# Patient Record
Sex: Female | Born: 1965 | Race: White | Hispanic: No | Marital: Single | Smoking: Former smoker
Health system: Southern US, Community
[De-identification: ages and names within clinical notes are randomized; demographics above are authoritative.]

## PROBLEM LIST (undated history)

## (undated) DIAGNOSIS — U099 Post covid-19 condition, unspecified: Secondary | ICD-10-CM

## (undated) DIAGNOSIS — E079 Disorder of thyroid, unspecified: Secondary | ICD-10-CM

## (undated) DIAGNOSIS — B1920 Unspecified viral hepatitis C without hepatic coma: Secondary | ICD-10-CM

## (undated) DIAGNOSIS — I10 Essential (primary) hypertension: Secondary | ICD-10-CM

## (undated) DIAGNOSIS — J449 Chronic obstructive pulmonary disease, unspecified: Secondary | ICD-10-CM

## (undated) HISTORY — PX: ABDOMINAL HYSTERECTOMY: SHX81

## (undated) HISTORY — PX: COLONOSCOPY: SHX174

---

## 2014-09-23 ENCOUNTER — Emergency Department (HOSPITAL_BASED_OUTPATIENT_CLINIC_OR_DEPARTMENT_OTHER): Payer: Self-pay

## 2014-09-23 ENCOUNTER — Encounter (HOSPITAL_BASED_OUTPATIENT_CLINIC_OR_DEPARTMENT_OTHER): Payer: Self-pay | Admitting: *Deleted

## 2014-09-23 ENCOUNTER — Emergency Department (HOSPITAL_BASED_OUTPATIENT_CLINIC_OR_DEPARTMENT_OTHER)
Admission: EM | Admit: 2014-09-23 | Discharge: 2014-09-23 | Disposition: A | Discharge status: Home / Self Care | Payer: Self-pay | Attending: Emergency Medicine | Admitting: Emergency Medicine

## 2014-09-23 DIAGNOSIS — R059 Cough, unspecified: Secondary | ICD-10-CM

## 2014-09-23 DIAGNOSIS — I10 Essential (primary) hypertension: Secondary | ICD-10-CM | POA: Insufficient documentation

## 2014-09-23 DIAGNOSIS — Z8619 Personal history of other infectious and parasitic diseases: Secondary | ICD-10-CM | POA: Insufficient documentation

## 2014-09-23 DIAGNOSIS — R05 Cough: Secondary | ICD-10-CM

## 2014-09-23 DIAGNOSIS — Z791 Long term (current) use of non-steroidal anti-inflammatories (NSAID): Secondary | ICD-10-CM | POA: Insufficient documentation

## 2014-09-23 DIAGNOSIS — Z79899 Other long term (current) drug therapy: Secondary | ICD-10-CM | POA: Insufficient documentation

## 2014-09-23 DIAGNOSIS — R079 Chest pain, unspecified: Secondary | ICD-10-CM | POA: Insufficient documentation

## 2014-09-23 DIAGNOSIS — E079 Disorder of thyroid, unspecified: Secondary | ICD-10-CM | POA: Insufficient documentation

## 2014-09-23 DIAGNOSIS — J441 Chronic obstructive pulmonary disease with (acute) exacerbation: Secondary | ICD-10-CM | POA: Insufficient documentation

## 2014-09-23 DIAGNOSIS — Z720 Tobacco use: Secondary | ICD-10-CM | POA: Insufficient documentation

## 2014-09-23 HISTORY — DX: Essential (primary) hypertension: I10

## 2014-09-23 HISTORY — DX: Chronic obstructive pulmonary disease, unspecified: J44.9

## 2014-09-23 HISTORY — DX: Unspecified viral hepatitis C without hepatic coma: B19.20

## 2014-09-23 HISTORY — DX: Disorder of thyroid, unspecified: E07.9

## 2014-09-23 MED ORDER — PROMETHAZINE-CODEINE 6.25-10 MG/5ML PO SYRP: 10.0000 mL | ORAL_SOLUTION | Freq: Four times a day (QID) | ORAL | Status: DC | PRN

## 2014-09-23 MED ORDER — TRAMADOL HCL 50 MG PO TABS
50.0000 mg | ORAL_TABLET | Freq: Four times a day (QID) | ORAL | Status: DC | PRN
Start: 2014-09-23 — End: 2019-06-13

## 2014-09-23 MED ORDER — NAPROXEN 500 MG PO TABS: 500.0000 mg | ORAL_TABLET | Freq: Two times a day (BID) | ORAL | Status: DC

## 2014-09-23 MED ORDER — ALBUTEROL SULFATE HFA 108 (90 BASE) MCG/ACT IN AERS: 1.0000 | INHALATION_SPRAY | Freq: Four times a day (QID) | RESPIRATORY_TRACT | Status: DC | PRN

## 2014-09-23 NOTE — ED Notes (Signed)
Pt c/o left sided chest and breast pain x 3 with SOB

## 2014-09-23 NOTE — ED Provider Notes (Signed)
CSN: [id number]     Arrival date & time 09/23/14  1339 History   First MD Initiated Contact with Patient 09/23/14 1401     Chief Complaint  Patient presents with  . Chest Pain     HPI  Disposition evaluation chest pain. Has had a cough for the last several days hurts her left lateral chest when she coughs dry nonproductive. Reports he continues to smoke. Out of her inhaler at home.  Past Medical History  Diagnosis Date  . Thyroid disease   . Hypertension   . COPD (chronic obstructive pulmonary disease)   . Hepatitis C    Past Surgical History  Procedure Laterality Date  . Abdominal hysterectomy     History reviewed. No pertinent family history. History  Substance Use Topics  . Smoking status: Current Every Day Smoker -- 1.00 packs/day    Types: Cigarettes  . Smokeless tobacco: Not on file  . Alcohol Use: No    Review of Systems  Constitutional: Negative for fever, chills, diaphoresis, appetite change and fatigue.  HENT: Negative for mouth sores, sore throat and trouble swallowing.   Eyes: Negative for visual disturbance.  Respiratory: Positive for shortness of breath. Negative for cough, chest tightness and wheezing.   Cardiovascular: Positive for chest pain.  Gastrointestinal: Negative for nausea, vomiting, abdominal pain, diarrhea and abdominal distention.  Endocrine: Negative for polydipsia, polyphagia and polyuria.  Genitourinary: Negative for dysuria, frequency and hematuria.  Musculoskeletal: Negative for gait problem.  Skin: Negative for color change, pallor and rash.  Neurological: Negative for dizziness, syncope, light-headedness and headaches.  Hematological: Does not bruise/bleed easily.  Psychiatric/Behavioral: Negative for behavioral problems and confusion.      Allergies  Review of patient's allergies indicates no known allergies.  Home Medications   Prior to Admission medications   Medication Sig Start Date End Date Taking? Authorizing Provider    albuterol (PROVENTIL HFA;VENTOLIN HFA) 108 (90 BASE) MCG/ACT inhaler Inhale 1-2 puffs into the lungs every 6 (six) hours as needed for wheezing. 09/23/14   Rolland PorterMark Lismary Kiehn, MD  levothyroxine (SYNTHROID, LEVOTHROID) 50 MCG tablet Take 25 mcg by mouth daily before breakfast.   Yes Historical Provider, MD  lisinopril (PRINIVIL,ZESTRIL) 10 MG tablet Take 10 mg by mouth daily.   Yes Historical Provider, MD  naproxen (NAPROSYN) 500 MG tablet Take 1 tablet (500 mg total) by mouth 2 (two) times daily. 09/23/14   Rolland PorterMark Ellorie Kindall, MD  traMADol (ULTRAM) 50 MG tablet Take 1 tablet (50 mg total) by mouth every 6 (six) hours as needed. 09/23/14   Rolland PorterMark Ewell Benassi, MD   BP 117/76 mmHg  Pulse 60  Temp(Src) 98.3 F (36.8 C) (Oral)  Resp 16  SpO2 100% Physical Exam  Constitutional: He is oriented to person, place, and time. He appears well-developed and well-nourished. No distress.  HENT:  Head: Normocephalic.  Eyes: Conjunctivae are normal. Pupils are equal, round, and reactive to light. No scleral icterus.  Neck: Normal range of motion. Neck supple. No thyromegaly present.  Cardiovascular: Normal rate and regular rhythm.  Exam reveals no gallop and no friction rub.   No murmur heard. Pulmonary/Chest: Effort normal and breath sounds normal. No respiratory distress. He has no wheezes. He has no rales.    Abdominal: Soft. Bowel sounds are normal. He exhibits no distension. There is no tenderness. There is no rebound.  Musculoskeletal: Normal range of motion.  Neurological: He is alert and oriented to person, place, and time.  Skin: Skin is warm and dry. No rash  noted.  Psychiatric: He has a normal mood and affect. His behavior is normal.    ED Course  Procedures (including critical care time) Labs Review Labs Reviewed - No data to display  Imaging Review Dg Chest 2 View  09/23/2014   CLINICAL DATA:  Productive cough  EXAM: CHEST  2 VIEW  COMPARISON:  None.  FINDINGS: Lungs are clear.  No pleural effusion or  pneumothorax.  The heart is normal in size.  Visualized osseous structures are within normal limits.  IMPRESSION: Normal chest radiographs.   Electronically Signed   By: Charline Bills M.D.   On: 09/23/2014 14:42     EKG Interpretation None      MDM   Final diagnoses:  Chest pain  Cough   Is not concerning for cardiac etiology. Clinically with clear lungs well oxygenated now wheezing. No graphic pneumonia. Plan is home. Strongly encouraged to stop smoking. Pain medicine and anti-inflammatories cause present.    Rolland Porter, MD 09/23/14 [phone]

## 2014-09-23 NOTE — Discharge Instructions (Signed)
Chest Pain (Nonspecific) °It is often hard to give a diagnosis for the cause of chest pain. There is always a chance that your pain could be related to something serious, such as a heart attack or a blood clot in the lungs. You need to follow up with your doctor. °HOME CARE °· If antibiotic medicine was given, take it as directed by your doctor. Finish the medicine even if you start to feel better. °· For the next few days, avoid activities that bring on chest pain. Continue physical activities as told by your doctor. °· Do not use any tobacco products. This includes cigarettes, chewing tobacco, and e-cigarettes. °· Avoid drinking alcohol. °· Only take medicine as told by your doctor. °· Follow your doctor's suggestions for more testing if your chest pain does not go away. °· Keep all doctor visits you made. °GET HELP IF: °· Your chest pain does not go away, even after treatment. °· You have a rash with blisters on your chest. °· You have a fever. °GET HELP RIGHT AWAY IF:  °· You have more pain or pain that spreads to your arm, neck, jaw, back, or belly (abdomen). °· You have shortness of breath. °· You cough more than usual or cough up blood. °· You have very bad back or belly pain. °· You feel sick to your stomach (nauseous) or throw up (vomit). °· You have very bad weakness. °· You pass out (faint). °· You have chills. °This is an emergency. Do not wait to see if the problems will go away. Call your local emergency services (911 in [place] Do not drive yourself to the hospital. °MAKE SURE YOU:  °· Understand these instructions. °· Will watch your condition. °· Will get help right away if you are not doing well or get worse. °Document Released: 11/13/2007 Document Revised: 06/01/2013 Document Reviewed: 11/13/2007 °ExitCare® Patient Information ©2015 [employer]. This information is not intended to replace advice given to you by your health care provider. Make sure you discuss any questions you have with your  health care provider. ° °Cough, Adult ° A cough is a reflex that helps clear your throat and airways. It can help heal the body or may be a reaction to an irritated airway. A cough may only last 2 or 3 weeks (acute) or may last more than 8 weeks (chronic).  °CAUSES °Acute cough: °· Viral or bacterial infections. °Chronic cough: °· Infections. °· Allergies. °· Asthma. °· Post-nasal drip. °· Smoking. °· Heartburn or acid reflux. °· Some medicines. °· Chronic lung problems (COPD). °· Cancer. °SYMPTOMS  °· Cough. °· Fever. °· Chest pain. °· Increased breathing rate. °· High-pitched whistling sound when breathing (wheezing). °· Colored mucus that you cough up (sputum). °TREATMENT  °· A bacterial cough may be treated with antibiotic medicine. °· A viral cough must run its course and will not respond to antibiotics. °· Your caregiver may recommend other treatments if you have a chronic cough. °HOME CARE INSTRUCTIONS  °· Only take over-the-counter or prescription medicines for pain, discomfort, or fever as directed by your caregiver. Use cough suppressants only as directed by your caregiver. °· Use a cold steam vaporizer or humidifier in your bedroom or home to help loosen secretions. °· Sleep in a semi-upright position if your cough is worse at night. °· Rest as needed. °· Stop smoking if you smoke. °SEEK IMMEDIATE MEDICAL CARE IF:  °· You have pus in your sputum. °· Your cough starts to worsen. °· You cannot   control your cough with suppressants and are losing sleep. °· You begin coughing up blood. °· You have difficulty breathing. °· You develop pain which is getting worse or is uncontrolled with medicine. °· You have a fever. °MAKE SURE YOU:  °· Understand these instructions. °· Will watch your condition. °· Will get help right away if you are not doing well or get worse. °Document Released: 11/23/2010 Document Revised: 08/19/2011 Document Reviewed: 11/23/2010 °ExitCare® Patient Information ©2015 [employer]. This  information is not intended to replace advice given to you by your health care provider. Make sure you discuss any questions you have with your health care provider. ° °

## 2016-05-27 IMAGING — DX DG CHEST 2V
2 series · 2 of 2 positions shown · non-contrast
Comparison: None.

CLINICAL DATA: Productive cough

EXAM:
CHEST  2 VIEW

[chest pa]
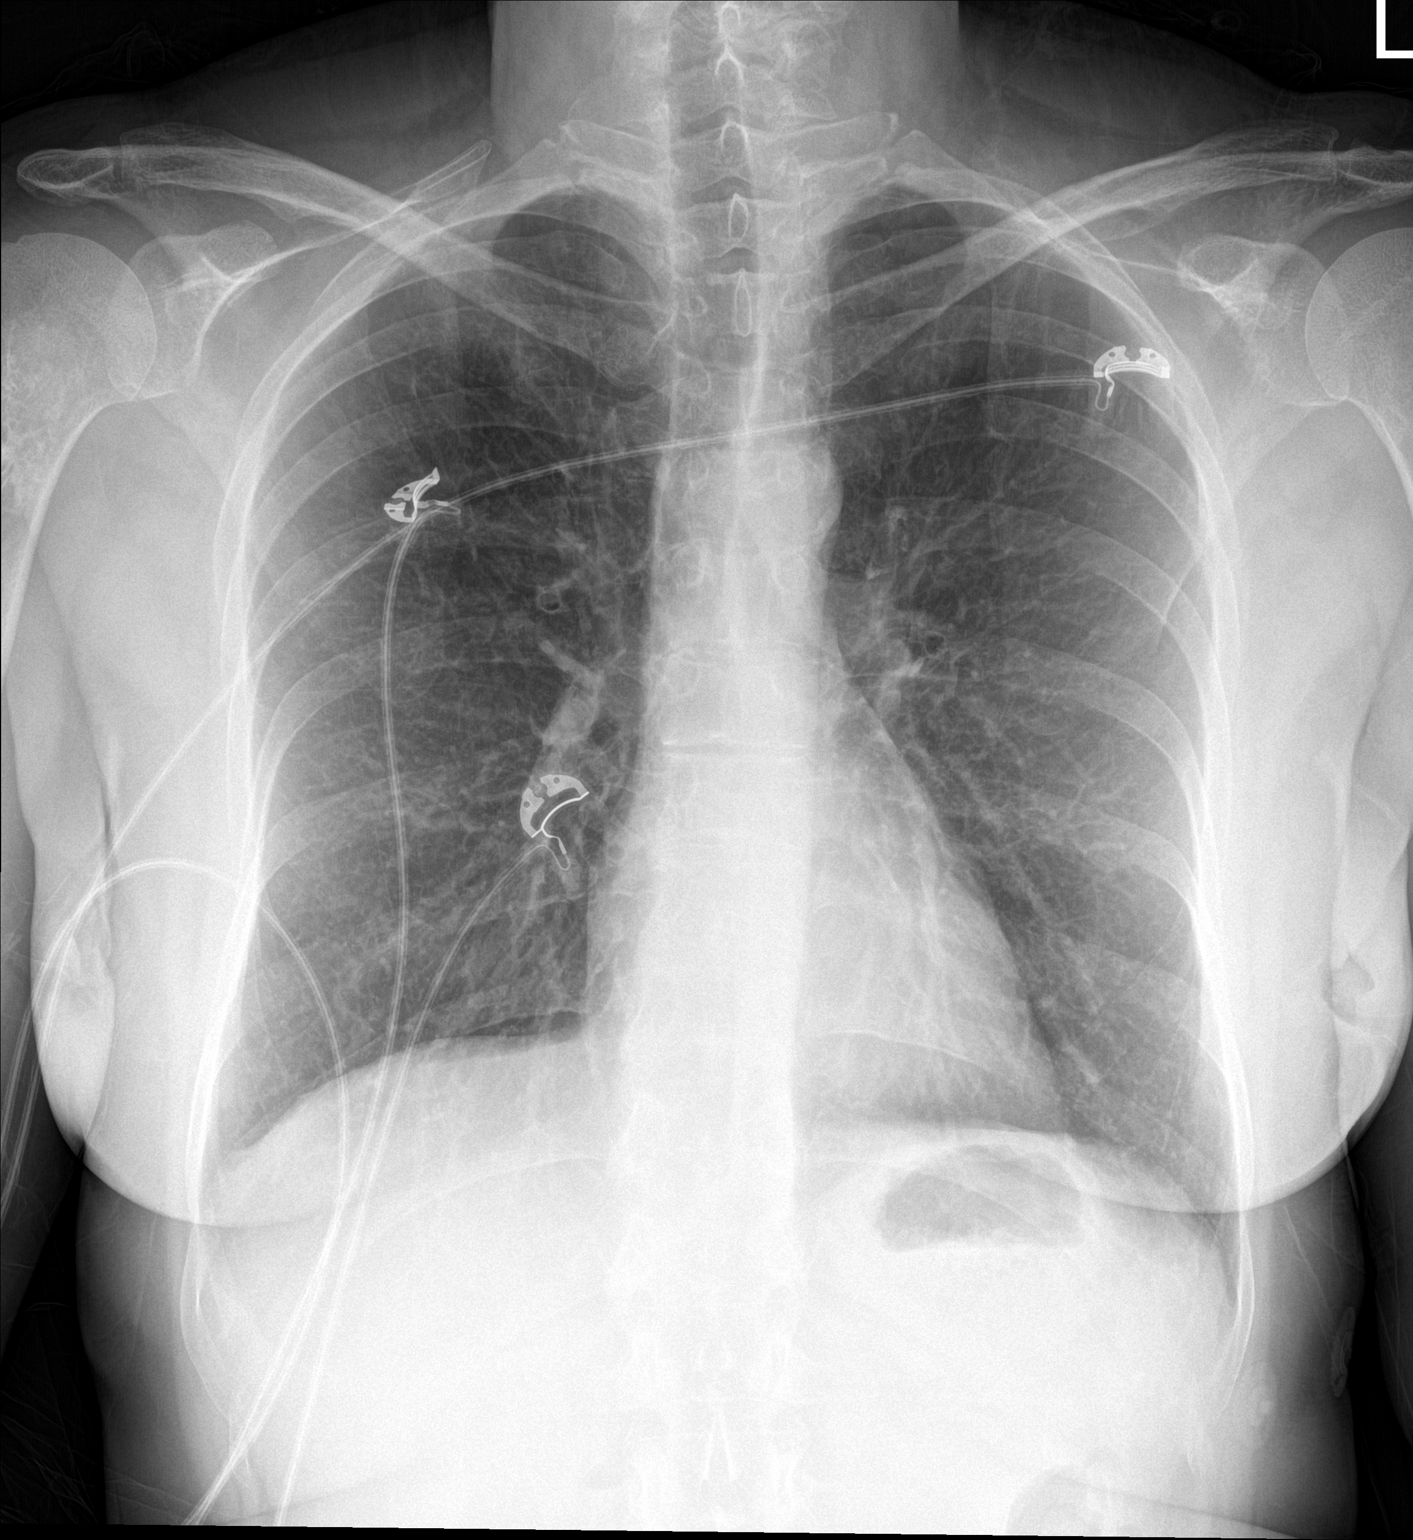

[chest lat]
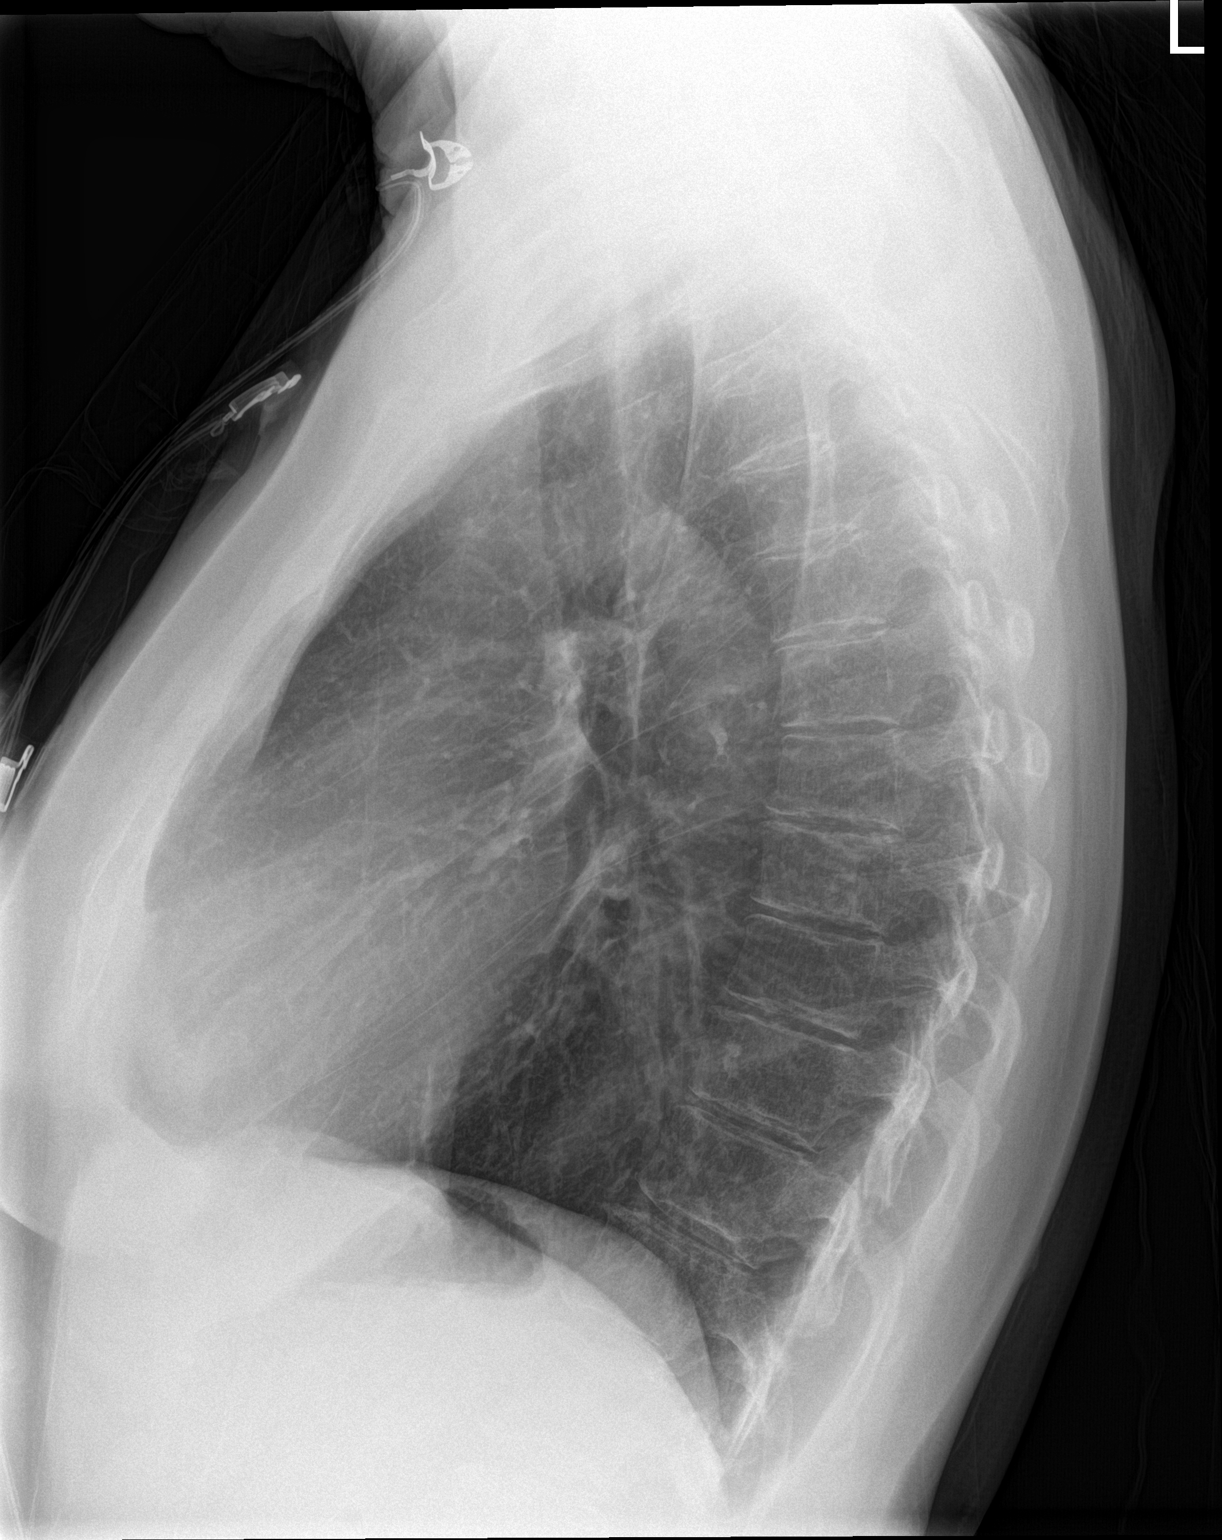

[2 of 2 positions shown; findings below may reference images not displayed]

FINDINGS: Lungs are clear.  No pleural effusion or pneumothorax.

The heart is normal in size.

Visualized osseous structures are within normal limits.
IMPRESSION: Normal chest radiographs.

## 2019-06-12 ENCOUNTER — Encounter (HOSPITAL_BASED_OUTPATIENT_CLINIC_OR_DEPARTMENT_OTHER): Payer: Self-pay | Admitting: Emergency Medicine

## 2019-06-12 ENCOUNTER — Other Ambulatory Visit: Payer: Self-pay

## 2019-06-12 DIAGNOSIS — J441 Chronic obstructive pulmonary disease with (acute) exacerbation: Secondary | ICD-10-CM | POA: Insufficient documentation

## 2019-06-12 DIAGNOSIS — Z79899 Other long term (current) drug therapy: Secondary | ICD-10-CM | POA: Insufficient documentation

## 2019-06-12 DIAGNOSIS — I10 Essential (primary) hypertension: Secondary | ICD-10-CM | POA: Insufficient documentation

## 2019-06-12 DIAGNOSIS — F17210 Nicotine dependence, cigarettes, uncomplicated: Secondary | ICD-10-CM | POA: Insufficient documentation

## 2019-06-12 DIAGNOSIS — Z20822 Contact with and (suspected) exposure to covid-19: Secondary | ICD-10-CM | POA: Insufficient documentation

## 2019-06-12 MED ORDER — ALBUTEROL SULFATE HFA 108 (90 BASE) MCG/ACT IN AERS
INHALATION_SPRAY | RESPIRATORY_TRACT | Status: AC
  Administered 2019-06-13: 2 via RESPIRATORY_TRACT
  Filled 2019-06-12: qty 6.7

## 2019-06-12 NOTE — ED Triage Notes (Signed)
Pt reports cough, shortness of breath and chest tightness since yesterday. Hx COPD. Reports she needs a med refill - has been without her respiratory meds for three days.

## 2019-06-13 ENCOUNTER — Emergency Department (HOSPITAL_BASED_OUTPATIENT_CLINIC_OR_DEPARTMENT_OTHER)
Admission: EM | Admit: 2019-06-13 | Discharge: 2019-06-13 | Disposition: A | Discharge status: Home / Self Care | Payer: Self-pay | Attending: Emergency Medicine | Admitting: Emergency Medicine

## 2019-06-13 DIAGNOSIS — J441 Chronic obstructive pulmonary disease with (acute) exacerbation: Secondary | ICD-10-CM

## 2019-06-13 LAB — SARS CORONAVIRUS 2 (TAT 6-24 HRS): SARS Coronavirus 2: NEGATIVE

## 2019-06-13 MED ORDER — PREDNISONE 50 MG PO TABS: 50.0000 mg | ORAL_TABLET | Freq: Every day | ORAL | 0 refills | Status: DC

## 2019-06-13 MED ORDER — ALBUTEROL SULFATE HFA 108 (90 BASE) MCG/ACT IN AERS: 2.0000 | INHALATION_SPRAY | RESPIRATORY_TRACT | Status: DC | PRN

## 2019-06-13 MED ORDER — LEVOFLOXACIN 500 MG PO TABS: 500.0000 mg | ORAL_TABLET | Freq: Every day | ORAL | 0 refills | Status: AC

## 2019-06-13 NOTE — ED Provider Notes (Signed)
[facility] Provider Note: Lowella Dell, MD, FACEP  CSN: [id number] MRN: [id number] ARRIVAL: 06/12/19 at 1948 ROOM: [facility]06/[facility]   CHIEF COMPLAINT  Cough   HISTORY OF PRESENT ILLNESS  06/13/19 1:18 AM Mallory Santiago is a 54 y.o. female with a history of COPD.  She has had a cough and shortness of breath along with chest tightness for the past 3 days.  Symptoms are made worse when breathing cold air.  Symptoms have been moderate.  She was given a new albuterol inhaler in triage with improvement in her shortness of breath.  She is requesting a course of steroids and Levaquin which is what she is usually given for acute exacerbations of her COPD.  She is visiting from out of town and does not have access to her primary care physician.  She denies fever.   Past Medical History:  Diagnosis Date  . COPD (chronic obstructive pulmonary disease) (HCC)   . Hepatitis C   . Hypertension   . Thyroid disease     Past Surgical History:  Procedure Laterality Date  . ABDOMINAL HYSTERECTOMY      No family history on file.  Social History   Tobacco Use  . Smoking status: Current Every Day Smoker    Packs/day: 1.00    Types: Cigarettes  . Smokeless tobacco: Never Used  Substance Use Topics  . Alcohol use: No  . Drug use: Yes    Prior to Admission medications   Medication Sig Start Date End Date Taking? Authorizing Provider  levofloxacin (LEVAQUIN) 500 MG tablet Take 1 tablet (500 mg total) by mouth daily. 06/13/19   Timarion Agcaoili, MD  levothyroxine (SYNTHROID, LEVOTHROID) 50 MCG tablet Take 25 mcg by mouth daily before breakfast.    [provider]  lisinopril (PRINIVIL,ZESTRIL) 10 MG tablet Take 10 mg by mouth daily.    [provider]  predniSONE (DELTASONE) 50 MG tablet Take 1 tablet (50 mg total) by mouth daily. 06/13/19   Autumne Kallio, MD  albuterol (PROVENTIL HFA;VENTOLIN HFA) 108 (90 BASE) MCG/ACT inhaler Inhale 1-2 puffs into the lungs every 6 (six) hours as  needed for wheezing. 09/23/14 06/13/19  Rolland Porter, MD    Allergies Codeine   REVIEW OF SYSTEMS  Negative except as noted here or in the History of Present Illness.   PHYSICAL EXAMINATION  Initial Vital Signs Blood pressure (!) 149/101, pulse 83, temperature 98 F (36.7 C), resp. rate 20, weight 63.4 kg, SpO2 96 %.  Examination General: Well-developed, well-nourished female in no acute distress; appearance consistent with age of record HENT: normocephalic; atraumatic Eyes: pupils equal, round and reactive to light; extraocular muscles intact Neck: supple Heart: regular rate and rhythm Lungs: clear to auscultation bilaterally; frequent dry cough Abdomen: soft; nondistended; nontender; bowel sounds present Extremities: No deformity; full range of motion; pulses normal Neurologic: Awake, alert and oriented; motor function intact in all extremities and symmetric; no facial droop Skin: Warm and dry Psychiatric: Normal mood and affect   RESULTS  Summary of this visit's results, reviewed and interpreted by myself:   EKG Interpretation  Date/Time:  Saturday June 12 2019 20:02:03 EST Ventricular Rate:  82 PR Interval:  140 QRS Duration: 86 QT Interval:  370 QTC Calculation: 432 R Axis:   61 Text Interpretation: Normal sinus rhythm Right atrial enlargement Nonspecific ST abnormality Abnormal ECG No significant change was found Confirmed by Glynn Octave [phone]) on 06/12/2019 8:25:51 PM      Laboratory Studies: No results found for  this or any previous visit (from the past 24 hour(s)). Imaging Studies: No results found.  ED COURSE and MDM  Nursing notes, initial and subsequent vitals signs, including pulse oximetry, reviewed and interpreted by myself.  Vitals:   06/12/19 1959 06/12/19 2006 06/12/19 2300 06/13/19 0124  BP: (!) 149/98  (!) 149/101   Pulse: 83  83   Resp: 18  20   Temp: 98 F (36.7 C)     SpO2: 99%  96% 94%  Weight:  63.4 kg     Medications   albuterol (VENTOLIN HFA) 108 (90 Base) MCG/ACT inhaler 2 puff (2 puffs Inhalation Given 06/13/19 0124)   Will place patient on Levaquin and steroids which is her usual protocol.  Will also test for Covid as this could be responsible for her acute exacerbation despite lack of fever or body aches.   PROCEDURES  Procedures   ED DIAGNOSES     ICD-10-CM   1. COPD exacerbation ([place])  J44.1        Isaack Preble, MD 06/13/19 [phone]

## 2019-06-14 ENCOUNTER — Telehealth (HOSPITAL_COMMUNITY): Payer: Self-pay

## 2020-01-15 ENCOUNTER — Other Ambulatory Visit: Payer: Self-pay

## 2020-01-15 DIAGNOSIS — J441 Chronic obstructive pulmonary disease with (acute) exacerbation: Secondary | ICD-10-CM | POA: Insufficient documentation

## 2020-01-15 DIAGNOSIS — Z20822 Contact with and (suspected) exposure to covid-19: Secondary | ICD-10-CM | POA: Insufficient documentation

## 2020-01-15 DIAGNOSIS — I10 Essential (primary) hypertension: Secondary | ICD-10-CM | POA: Insufficient documentation

## 2020-01-15 DIAGNOSIS — F17210 Nicotine dependence, cigarettes, uncomplicated: Secondary | ICD-10-CM | POA: Insufficient documentation

## 2020-01-16 ENCOUNTER — Emergency Department (HOSPITAL_BASED_OUTPATIENT_CLINIC_OR_DEPARTMENT_OTHER)
Admission: EM | Admit: 2020-01-16 | Discharge: 2020-01-16 | Disposition: A | Discharge status: Home / Self Care | Payer: Self-pay | Attending: Emergency Medicine | Admitting: Emergency Medicine

## 2020-01-16 ENCOUNTER — Encounter (HOSPITAL_BASED_OUTPATIENT_CLINIC_OR_DEPARTMENT_OTHER): Payer: Self-pay | Admitting: *Deleted

## 2020-01-16 ENCOUNTER — Emergency Department (HOSPITAL_BASED_OUTPATIENT_CLINIC_OR_DEPARTMENT_OTHER): Payer: Self-pay

## 2020-01-16 DIAGNOSIS — J441 Chronic obstructive pulmonary disease with (acute) exacerbation: Secondary | ICD-10-CM

## 2020-01-16 LAB — SARS CORONAVIRUS 2 BY RT PCR (HOSPITAL ORDER, PERFORMED IN ~~LOC~~ HOSPITAL LAB): SARS Coronavirus 2: NEGATIVE

## 2020-01-16 MED ORDER — DOXYCYCLINE HYCLATE 100 MG PO CAPS: 100.0000 mg | ORAL_CAPSULE | Freq: Two times a day (BID) | ORAL | 0 refills | Status: DC

## 2020-01-16 MED ORDER — IPRATROPIUM BROMIDE HFA 17 MCG/ACT IN AERS
4.0000 | INHALATION_SPRAY | Freq: Once | RESPIRATORY_TRACT | Status: AC
  Administered 2020-01-16: 4 via RESPIRATORY_TRACT

## 2020-01-16 MED ORDER — DEXAMETHASONE SODIUM PHOSPHATE 10 MG/ML IJ SOLN
10.0000 mg | Freq: Once | INTRAMUSCULAR | Status: AC
  Administered 2020-01-16: 10 mg via INTRAMUSCULAR
  Filled 2020-01-16: qty 1

## 2020-01-16 MED ORDER — DOXYCYCLINE HYCLATE 100 MG PO TABS
100.0000 mg | ORAL_TABLET | Freq: Once | ORAL | Status: AC
  Administered 2020-01-16: 100 mg via ORAL
  Filled 2020-01-16: qty 1

## 2020-01-16 MED ORDER — ALBUTEROL SULFATE HFA 108 (90 BASE) MCG/ACT IN AERS
8.0000 | INHALATION_SPRAY | Freq: Once | RESPIRATORY_TRACT | Status: AC
  Administered 2020-01-16: 8 via RESPIRATORY_TRACT

## 2020-01-16 NOTE — ED Triage Notes (Signed)
Hx of COPD. Reports cough for several days and wheezing

## 2020-01-16 NOTE — ED Provider Notes (Signed)
[facility] EMERGENCY DEPARTMENT Provider Note   CSN: [id number] Arrival date & time: 01/15/20  2352     History Chief Complaint  Patient presents with  . Cough    Mallory Santiago is a 54 y.o. female.  The history is provided by the patient.  Cough Cough characteristics:  Non-productive Severity:  Moderate Onset quality:  Gradual Duration:  3 days Timing:  Intermittent Progression:  Unchanged Chronicity:  Recurrent Smoker: yes   Context: not animal exposure and not weather changes   Relieved by:  Nothing Worsened by:  Nothing Ineffective treatments:  Beta-agonist inhaler Associated symptoms: wheezing   Associated symptoms: no chest pain, no diaphoresis, no ear pain, no fever, no headaches, no myalgias, no rash, no shortness of breath, no sinus congestion and no sore throat   Wheezing:    Severity:  Moderate   Onset quality:  Gradual   Duration:  3 days   Timing:  Constant   Progression:  Unchanged   Chronicity:  Recurrent Risk factors: chemical exposure   Patient with COPD with 3 days of dry cough and wheezing.  No f/c/r.  No CP, no exertional symptoms.  No neck or back pain.       Past Medical History:  Diagnosis Date  . COPD (chronic obstructive pulmonary disease) (HCC)   . Hepatitis C   . Hypertension   . Thyroid disease     There are no problems to display for this patient.   Past Surgical History:  Procedure Laterality Date  . ABDOMINAL HYSTERECTOMY       OB History   No obstetric history on file.     History reviewed. No pertinent family history.  Social History   Tobacco Use  . Smoking status: Current Every Day Smoker    Packs/day: 1.00    Types: Cigarettes  . Smokeless tobacco: Never Used  Substance Use Topics  . Alcohol use: No  . Drug use: Yes    Home Medications Prior to Admission medications   Medication Sig Start Date End Date Taking? Authorizing Provider  doxycycline (VIBRAMYCIN) 100 MG capsule Take 1 capsule (100 mg  total) by mouth 2 (two) times daily. One po bid x 7 days 01/16/20   Nicanor Alcon, Rhylin Venters, MD  levofloxacin (LEVAQUIN) 500 MG tablet Take 1 tablet (500 mg total) by mouth daily. 06/13/19   Molpus, John, MD  levothyroxine (SYNTHROID, LEVOTHROID) 50 MCG tablet Take 25 mcg by mouth daily before breakfast.    [provider]  lisinopril (PRINIVIL,ZESTRIL) 10 MG tablet Take 10 mg by mouth daily.    [provider]  predniSONE (DELTASONE) 50 MG tablet Take 1 tablet (50 mg total) by mouth daily. 06/13/19   Molpus, John, MD  albuterol (PROVENTIL HFA;VENTOLIN HFA) 108 (90 BASE) MCG/ACT inhaler Inhale 1-2 puffs into the lungs every 6 (six) hours as needed for wheezing. 09/23/14 06/13/19  Rolland Porter, MD    Allergies    Codeine  Review of Systems   Review of Systems  Constitutional: Negative for diaphoresis and fever.  HENT: Negative for ear pain and sore throat.   Eyes: Negative for visual disturbance.  Respiratory: Positive for cough and wheezing. Negative for shortness of breath.   Cardiovascular: Negative for chest pain.  Genitourinary: Negative for difficulty urinating.  Musculoskeletal: Negative for myalgias.  Skin: Negative for rash.  Neurological: Negative for headaches.  Psychiatric/Behavioral: Negative for agitation.  All other systems reviewed and are negative.   Physical Exam Updated Vital Signs BP 123/67 (BP  Location: Right Arm)   Pulse 83   Temp 97.6 F (36.4 C) (Oral)   Resp 14   SpO2 96%   Physical Exam Vitals and nursing note reviewed.  Constitutional:      General: Mallory Santiago is not in acute distress.    Appearance: Normal appearance.  HENT:     Head: Normocephalic and atraumatic.     Nose: Nose normal.  Eyes:     Conjunctiva/sclera: Conjunctivae normal.     Pupils: Pupils are equal, round, and reactive to light.  Cardiovascular:     Rate and Rhythm: Normal rate and regular rhythm.     Pulses: Normal pulses.     Heart sounds: Normal heart sounds.  Pulmonary:      Breath sounds: Wheezing present.  Abdominal:     General: Abdomen is flat. Bowel sounds are normal.     Tenderness: There is no abdominal tenderness. There is no guarding.  Musculoskeletal:        General: Normal range of motion.     Cervical back: Normal range of motion and neck supple.  Skin:    General: Skin is warm and dry.     Capillary Refill: Capillary refill takes less than 2 seconds.  Neurological:     General: No focal deficit present.     Mental Status: Mallory Santiago is alert and oriented to person, place, and time.     Deep Tendon Reflexes: Reflexes normal.  Psychiatric:        Mood and Affect: Mood normal.        Behavior: Behavior normal.     ED Results / Procedures / Treatments   Labs (all labs ordered are listed, but only abnormal results are displayed) Results for orders placed or performed during the hospital encounter of 01/16/20  SARS Coronavirus 2 by RT PCR (hospital order, performed in [facility] hospital lab) Nasopharyngeal Nasopharyngeal Swab   Specimen: Nasopharyngeal Swab  Result Value Ref Range   SARS Coronavirus 2 NEGATIVE NEGATIVE   DG Chest Portable 1 View  Result Date: 01/16/2020 CLINICAL DATA:  Cough and wheezing for several days. History of COPD. Smoker. EXAM: PORTABLE CHEST 1 VIEW COMPARISON:  09/23/2014 FINDINGS: Emphysematous changes in the lungs. Heart size and pulmonary vascularity are normal. No airspace disease or consolidation in the lungs. No pleural effusions. No pneumothorax. Mediastinal contours appear intact. Calcification of the aorta. 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 IMPRESSION: Emphysematous changes in the lungs. No evidence of active pulmonary disease. Electronically Signed   By: Burman Nieves M.D.   On: 01/16/2020 00:56    Radiology DG Chest Portable 1 View  Result Date: 01/16/2020 CLINICAL DATA:  Cough and wheezing for several days. History of COPD. Smoker. EXAM: PORTABLE CHEST 1 VIEW  COMPARISON:  09/23/2014 FINDINGS: Emphysematous changes in the lungs. Heart size and pulmonary vascularity are normal. No airspace disease or consolidation in the lungs. No pleural effusions. No pneumothorax. Mediastinal contours appear intact. Calcification of the aorta. 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 IMPRESSION: Emphysematous changes in the lungs. No evidence of active pulmonary disease. Electronically Signed   By: Burman Nieves M.D.   On: 01/16/2020 00:56    Procedures Procedures (including critical care time)  Medications Ordered in ED Medications  albuterol (VENTOLIN HFA) 108 (90 Base) MCG/ACT inhaler 8 puff (8 puffs Inhalation Given 01/16/20 0102)  ipratropium (ATROVENT HFA) inhaler 4 puff (4 puffs Inhalation Given 01/16/20 0105)  dexamethasone (DECADRON) injection 10 mg (10 mg  Intramuscular Given 01/16/20 0248)  doxycycline (VIBRA-TABS) tablet 100 mg (100 mg Oral Given 01/16/20 0247)    ED Course  I have reviewed the triage vital signs and the nursing notes.  Pertinent labs & imaging results that were available during my care of the patient were reviewed by me and considered in my medical decision making (see chart for details).    Symptoms have completely resolved post medication.  There is no PNA on CXR and covid is negative.  Patient denies any chest pain or cardiac symptoms and I do not believe we need a cardiac work up at this time.  Patient has normal vital signs and is stable for discharge with close follow up.   Mallory Santiago was evaluated in Emergency Department on 01/16/2020 for the symptoms described in the history of present illness. Mallory Santiago was evaluated in the context of the global COVID-19 pandemic, which necessitated consideration that the patient might be at risk for infection with the SARS-CoV-2 virus that causes COVID-19. Institutional protocols and algorithms that pertain to the evaluation of patients at risk for COVID-19 are in a state  of rapid change based on information released by regulatory bodies including the CDC and federal and state organizations. These policies and algorithms were followed during the patient's care in the ED.  Final Clinical Impression(s) / ED Diagnoses Final diagnoses:  COPD exacerbation (HCC)   Return for intractable cough, coughing up blood,fevers >100.4 unrelieved by medication, shortness of breath, intractable vomiting, chest pain, shortness of breath, weakness,numbness, changes in speech, facial asymmetry,abdominal pain, passing out,Inability to tolerate liquids or food, cough, altered mental status or any concerns. No signs of systemic illness or infection. The patient is nontoxic-appearing on exam and vital signs are within normal limits.   I have reviewed the triage vital signs and the nursing notes. Pertinent labs &imaging results that were available during my care of the patient were reviewed by me and considered in my medical decision making (see chart for details).After history, exam, and medical workup I feel the patient has beenappropriately medically screened and is safe for discharge home. Pertinent diagnoses were discussed with the patient. Patient was given return precautions.Rx / DC Orders ED Discharge Orders         Ordered    doxycycline (VIBRAMYCIN) 100 MG capsule  2 times daily     Discontinue  Reprint     01/16/20 0258           Disa Riedlinger, MD 01/16/20 3299

## 2020-02-21 ENCOUNTER — Telehealth: Payer: MEDICAID | Admitting: Family

## 2020-02-21 DIAGNOSIS — J441 Chronic obstructive pulmonary disease with (acute) exacerbation: Secondary | ICD-10-CM

## 2020-02-21 MED ORDER — BENZONATATE 100 MG PO CAPS: 100.0000 mg | ORAL_CAPSULE | Freq: Two times a day (BID) | ORAL | 0 refills | Status: DC | PRN

## 2020-02-21 MED ORDER — PREDNISONE 10 MG (21) PO TBPK: ORAL_TABLET | ORAL | 0 refills | Status: DC

## 2020-02-21 NOTE — Progress Notes (Signed)
Good! I am glad to know that you have a visit coming up.  We are sorry that you are not feeling well.  Here is how we plan to help!  Based on your presentation I believe you most likely have A cough due to a virus.  This is called viral bronchitis and is best treated by rest, plenty of fluids and control of the cough.  You may use Ibuprofen or Tylenol as directed to help your symptoms.     In addition you may use A prescription cough medication called Tessalon Perles 100mg . You may take 1-2 capsules every 8 hours as needed for your cough.  Prednisone 10 mg daily for 6 days (see taper instructions below)  Directions for 6 day taper: Day 1: 2 tablets before breakfast, 1 after both lunch & dinner and 2 at bedtime Day 2: 1 tab before breakfast, 1 after both lunch & dinner and 2 at bedtime Day 3: 1 tab at each meal & 1 at bedtime Day 4: 1 tab at breakfast, 1 at lunch, 1 at bedtime Day 5: 1 tab at breakfast & 1 tab at bedtime Day 6: 1 tab at breakfast   From your responses in the eVisit questionnaire you describe inflammation in the upper respiratory tract which is causing a significant cough.  This is commonly called Bronchitis and has four common causes:    Allergies  Viral Infections  Acid Reflux  Bacterial Infection Allergies, viruses and acid reflux are treated by controlling symptoms or eliminating the cause. An example might be a cough caused by taking certain blood pressure medications. You stop the cough by changing the medication. Another example might be a cough caused by acid reflux. Controlling the reflux helps control the cough.  USE OF BRONCHODILATOR ("RESCUE") INHALERS: There is a risk from using your bronchodilator too frequently.  The risk is that over-reliance on a medication which only relaxes the muscles surrounding the breathing tubes can reduce the effectiveness of medications prescribed to reduce swelling and congestion of the tubes themselves.  Although you feel  brief relief from the bronchodilator inhaler, your asthma may actually be worsening with the tubes becoming more swollen and filled with mucus.  This can delay other crucial treatments, such as oral steroid medications. If you need to use a bronchodilator inhaler daily, several times per day, you should discuss this with your provider.  There are probably better treatments that could be used to keep your asthma under control.     HOME CARE . Only take medications as instructed by your medical team. . Complete the entire course of an antibiotic. . Drink plenty of fluids and get plenty of rest. . Avoid close contacts especially the very young and the elderly . Cover your mouth if you cough or cough into your sleeve. . Always remember to wash your hands . A steam or ultrasonic humidifier can help congestion.   GET HELP RIGHT AWAY IF: . You develop worsening fever. . You become short of breath . You cough up blood. . Your symptoms persist after you have completed your treatment plan MAKE SURE YOU   Understand these instructions.  Will watch your condition.  Will get help right away if you are not doing well or get worse.  Your e-visit answers were reviewed by a board certified advanced clinical practitioner to complete your personal care plan.  Depending on the condition, your plan could have included both over the counter or prescription medications. If there is a  problem please reply  once you have received a response from your provider. Your safety is important to [place].  If you have drug allergies check your prescription carefully.    You can use MyChart to ask questions about today's visit, request a non-urgent call back, or ask for a work or school excuse for 24 hours related to this e-Visit. If it has been greater than 24 hours you will need to follow up with your provider, or enter a new e-Visit to address those concerns. You will get an e-mail in the next two days asking about your  experience.  I hope that your e-visit has been valuable and will speed your recovery. Thank you for using e-visits.   Greater than 5 minutes, yet less than 10 minutes of time have been spent researching, coordinating, and implementing care for this patient today.  Thank you for the details you included in the comment boxes. Those details are very helpful in determining the best course of treatment for you and help [place] to provide the best care.

## 2020-02-21 NOTE — Progress Notes (Signed)
Based on what you shared with me, I feel your condition warrants further evaluation and I recommend that you be seen for a face to face office visit.  It appears that you have several office visits related to COPD over the past few months.Please see someone in person so that we can be sure that you are prescribed a medication regimen or referral as needed.  I apologize for any inconvenience. I want you to get the safest, most effective care.  NOTE: If you entered your credit card information for this eVisit, you will not be charged. You may see a "hold" on your card for the $35 but that hold will drop off and you will not have a charge processed.   If you are having a true medical emergency please call 911.      For an urgent face to face visit, [facility] has five urgent care centers for your convenience:      NEW:  [facility] at [facility] Directions [phone] [address] [place], [place] [address] . 10 am - 6pm Monday - Friday    [facility]) Get Driving Directions [phone] [address] [place], [place] [address] . 10 am to 8 pm Monday-Friday . 12 pm to 8 pm [facility]gent Care at [facility] Get Driving Directions [phone] [address] [place], [place] [address] . 8 am to 8 pm Monday-Friday . 9 am to 6 pm Saturday . 11 am to 6 pm Sunday     [facility] at [facility] Get Driving Directions  [phone] [address] [place], [place] [address] . 8 am to 8 pm Monday-Friday . 8 am to 4 pm [facility] Urgent Care at [facility] Directions [phone] [address] [place], [place] [address] . 12 pm to 6 pm Monday-Friday      Your e-visit answers were reviewed by a board certified advanced clinical practitioner to complete your personal care plan.  Thank you for  using e-Visits.

## 2020-02-21 NOTE — Addendum Note (Signed)
Addended by: Worthy Rancher B on: 02/21/2020 02:23 PM   Modules accepted: Orders

## 2021-05-17 ENCOUNTER — Other Ambulatory Visit: Payer: Self-pay

## 2021-05-17 ENCOUNTER — Encounter (HOSPITAL_BASED_OUTPATIENT_CLINIC_OR_DEPARTMENT_OTHER): Payer: Self-pay | Admitting: *Deleted

## 2021-05-17 ENCOUNTER — Emergency Department (HOSPITAL_BASED_OUTPATIENT_CLINIC_OR_DEPARTMENT_OTHER): Payer: Medicaid Other

## 2021-05-17 ENCOUNTER — Emergency Department (HOSPITAL_BASED_OUTPATIENT_CLINIC_OR_DEPARTMENT_OTHER)
Admission: EM | Admit: 2021-05-17 | Discharge: 2021-05-17 | Disposition: A | Discharge status: Home / Self Care | Payer: Medicaid Other | Attending: Student | Admitting: Student

## 2021-05-17 DIAGNOSIS — I10 Essential (primary) hypertension: Secondary | ICD-10-CM | POA: Diagnosis not present

## 2021-05-17 DIAGNOSIS — J069 Acute upper respiratory infection, unspecified: Secondary | ICD-10-CM | POA: Diagnosis not present

## 2021-05-17 DIAGNOSIS — Z20822 Contact with and (suspected) exposure to covid-19: Secondary | ICD-10-CM | POA: Diagnosis not present

## 2021-05-17 DIAGNOSIS — Z79899 Other long term (current) drug therapy: Secondary | ICD-10-CM | POA: Diagnosis not present

## 2021-05-17 DIAGNOSIS — Z87891 Personal history of nicotine dependence: Secondary | ICD-10-CM | POA: Insufficient documentation

## 2021-05-17 DIAGNOSIS — Z7952 Long term (current) use of systemic steroids: Secondary | ICD-10-CM | POA: Insufficient documentation

## 2021-05-17 DIAGNOSIS — J441 Chronic obstructive pulmonary disease with (acute) exacerbation: Secondary | ICD-10-CM | POA: Diagnosis not present

## 2021-05-17 DIAGNOSIS — R0789 Other chest pain: Secondary | ICD-10-CM | POA: Diagnosis not present

## 2021-05-17 DIAGNOSIS — R059 Cough, unspecified: Secondary | ICD-10-CM | POA: Diagnosis present

## 2021-05-17 LAB — RESP PANEL BY RT-PCR (FLU A&B, COVID) ARPGX2
Influenza A by PCR: NEGATIVE
Influenza B by PCR: NEGATIVE
SARS Coronavirus 2 by RT PCR: NEGATIVE

## 2021-05-17 MED ORDER — BENZONATATE 100 MG PO CAPS: 100.0000 mg | ORAL_CAPSULE | Freq: Three times a day (TID) | ORAL | 0 refills | Status: DC

## 2021-05-17 MED ORDER — IPRATROPIUM-ALBUTEROL 0.5-2.5 (3) MG/3ML IN SOLN
3.0000 mL | Freq: Once | RESPIRATORY_TRACT | Status: AC
  Administered 2021-05-17: 3 mL via RESPIRATORY_TRACT
  Filled 2021-05-17: qty 3

## 2021-05-17 MED ORDER — PREDNISONE 50 MG PO TABS: 50.0000 mg | ORAL_TABLET | Freq: Every day | ORAL | 0 refills | Status: DC

## 2021-05-17 MED ORDER — PREDNISONE 50 MG PO TABS: 50.0000 mg | ORAL_TABLET | Freq: Every day | ORAL | 0 refills | Status: AC

## 2021-05-17 MED ORDER — METHYLPREDNISOLONE SODIUM SUCC 125 MG IJ SOLR
125.0000 mg | Freq: Once | INTRAMUSCULAR | Status: AC
  Administered 2021-05-17: 125 mg via INTRAMUSCULAR
  Filled 2021-05-17: qty 2

## 2021-05-17 MED ORDER — ACETAMINOPHEN-CODEINE #3 300-30 MG PO TABS: 1.0000 | ORAL_TABLET | Freq: Four times a day (QID) | ORAL | 0 refills | Status: AC | PRN

## 2021-05-17 NOTE — ED Provider Notes (Signed)
[facility] EMERGENCY DEPARTMENT Provider Note   CSN: [id number] Arrival date & time: 05/17/21  1441     History Chief Complaint  Patient presents with   Cough    Mallory Santiago is a 55 y.o. female with past history of COPD presents for evaluation of worsening cough, shortness of breath and chest wall pain secondary to cough.  She has been taking her albuterol and COPD medications at home without any relief.  She can taking Tylenol as needed for fevers, although she has not had a fever today.  She has been using OTC cough medications at home for symptoms without any relief.  She has lost her voice due to sore throat from coughing, states that her cough is nonproductive.  No other alleviating or aggravating factors.  Patient denies abdominal pain, nausea, vomiting, diarrhea, urinary symptoms and syncope.   Cough Associated symptoms: chills, fever, headaches, myalgias, sore throat and wheezing   Associated symptoms: no chest pain       Past Medical History:  Diagnosis Date   COPD (chronic obstructive pulmonary disease) (HCC)    Hepatitis C    Hypertension    Thyroid disease     There are no problems to display for this patient.   Past Surgical History:  Procedure Laterality Date   ABDOMINAL HYSTERECTOMY       OB History   No obstetric history on file.     No family history on file.  Social History   Tobacco Use   Smoking status: Former    Packs/day: 1.00    Types: Cigarettes   Smokeless tobacco: Never  Vaping Use   Vaping Use: Never used  Substance Use Topics   Alcohol use: No   Drug use: Yes    Types: Marijuana    Home Medications Prior to Admission medications   Medication Sig Start Date End Date Taking? Authorizing Provider  acetaminophen-codeine (TYLENOL #3) 300-30 MG tablet Take 1-2 tablets by mouth every 6 (six) hours as needed for moderate pain. 05/17/21  Yes Kathe Becton R, PA-C  doxycycline (VIBRAMYCIN) 100 MG capsule Take 1 capsule (100  mg total) by mouth 2 (two) times daily. One po bid x 7 days 01/16/20   Randal Buba, April, MD  levofloxacin (LEVAQUIN) 500 MG tablet Take 1 tablet (500 mg total) by mouth daily. 06/13/19   Molpus, John, MD  levothyroxine (SYNTHROID, LEVOTHROID) 50 MCG tablet Take 25 mcg by mouth daily before breakfast.    [provider]  lisinopril (PRINIVIL,ZESTRIL) 10 MG tablet Take 10 mg by mouth daily.    [provider]  predniSONE (DELTASONE) 50 MG tablet Take 1 tablet (50 mg total) by mouth daily for 5 days. 05/17/21 05/22/21  Tonye Pearson, PA-C  albuterol (PROVENTIL HFA;VENTOLIN HFA) 108 (90 BASE) MCG/ACT inhaler Inhale 1-2 puffs into the lungs every 6 (six) hours as needed for wheezing. 09/23/14 06/13/19  Tanna Furry, MD    Allergies    Codeine  Review of Systems   Review of Systems  Constitutional:  Positive for chills and fever. Negative for appetite change and fatigue.  HENT:  Positive for sore throat and voice change.   Eyes:  Negative for redness and visual disturbance.  Respiratory:  Positive for cough and wheezing. Negative for chest tightness.   Cardiovascular:  Negative for chest pain.  Gastrointestinal:  Negative for abdominal pain, diarrhea and vomiting.  Endocrine: Negative.   Genitourinary: Negative.   Musculoskeletal:  Positive for myalgias.  Skin: Negative.  Neurological:  Positive for headaches.  Psychiatric/Behavioral: Negative.    All other systems reviewed and are negative.  Physical Exam Updated Vital Signs BP (!) 141/79 (BP Location: Right Arm)   Pulse 80   Temp 98.3 F (36.8 C) (Oral)   Resp 15   Ht 5\' 2"  (1.575 m)   Wt 59 kg   SpO2 96%   BMI 23.78 kg/m   Physical Exam Vitals and nursing note reviewed.  Constitutional:      General: She is not in acute distress.    Appearance: She is ill-appearing.  HENT:     Head: Atraumatic.     Nose: Nose normal.  Eyes:     Extraocular Movements: Extraocular movements intact.     Conjunctiva/sclera:  Conjunctivae normal.  Cardiovascular:     Rate and Rhythm: Normal rate and regular rhythm.     Pulses: Normal pulses.          Radial pulses are 2+ on the right side and 2+ on the left side.       Dorsalis pedis pulses are 2+ on the right side and 2+ on the left side.     Heart sounds: No murmur heard. Pulmonary:     Effort: Pulmonary effort is normal. No accessory muscle usage or respiratory distress.     Breath sounds: Wheezing present. No rhonchi or rales.     Comments: Expiratory wheezing bilaterally.  No rhonchi or rales.  No tachypnea, no accessory muscle use, no acute distress, no increased work of breathing, no decrease in air movement  Chest:       Comments: Focal chest wall tenderness at the level of the fifth rib on the right side.  No palpable deformities no flail chest, no ecchymosis, no erythema Abdominal:     General: Abdomen is flat. There is no distension.     Palpations: Abdomen is soft.     Tenderness: There is no abdominal tenderness.  Musculoskeletal:        General: Normal range of motion.     Cervical back: Normal range of motion.  Skin:    General: Skin is warm and dry.     Capillary Refill: Capillary refill takes less than 2 seconds.  Neurological:     General: No focal deficit present.     Mental Status: She is alert.  Psychiatric:        Mood and Affect: Mood normal.    ED Results / Procedures / Treatments   Labs (all labs ordered are listed, but only abnormal results are displayed) Labs Reviewed  RESP PANEL BY RT-PCR (FLU A&B, COVID) ARPGX2    EKG None  Radiology DG Chest 2 View  Result Date: 05/17/2021 CLINICAL DATA:  Wheezing, fever and cough. EXAM: CHEST - 2 VIEW COMPARISON:  Chest x-ray 07/04/2020. FINDINGS: The heart size and mediastinal contours are within normal limits. Both lungs are clear. Mild compression deformity of midthoracic vertebral body is unchanged. No acute fractures are seen. Sclerosis in the proximal right humerus is  unchanged. IMPRESSION: No active cardiopulmonary disease. Electronically Signed   By: Ronney Asters M.D.   On: 05/17/2021 17:10    Procedures Procedures   Medications Ordered in ED Medications  methylPREDNISolone sodium succinate (SOLU-MEDROL) 125 mg/2 mL injection 125 mg (125 mg Intramuscular Given 05/17/21 1621)  ipratropium-albuterol (DUONEB) 0.5-2.5 (3) MG/3ML nebulizer solution 3 mL (3 mLs Nebulization Given 05/17/21 1629)    ED Course  I have reviewed the triage vital signs and the nursing  notes.  Pertinent labs & imaging results that were available during my care of the patient were reviewed by me and considered in my medical decision making (see chart for details).    MDM Rules/Calculators/A&P                         Initial impression: 55year old with COPD presenting with URI type illness as described above. Patient is in no acute distress, resting comfortably on bed in exam room.  Vitals are stable.  There is bilateral expiratory wheezing in all lung fields without rhonchi or rales.  No signs of dehydration, tolerating PO's.  Give her 125 mg Solu-Medrol along with a DuoNeb to help with her wheezing.  Workup: Negative respiratory panel, chest x-ray negative for pneumonia, infiltrate and rib fractures  Reassessment: Patient feels slightly better after DuoNeb treatment.  She continues to oxygenate at around 97% on room air without tachypnea or accessory muscle use.  No acute respiratory distress.  Rule-out: Rule out pneumonia, patient had clear lung exam with no increased work of breathing. Xray not indicated.   Plan: Patient likely has a viral URI that is exacerbated her COPD.  Since she is stable here in ED, with oxygen above 96% on room air, afebrile, nontachypneic, patient is going to discharge with outpatient follow-up.  I have given her an a course of steroids for her to start tomorrow.  I also sent in cough medication for her.  Patient insists that Kimberlee Nearing does not work for  her, but that codeine helps.  She has a codeine allergy listed in her chart, but patient repeatedly insists that she does not have a codeine allergy and it is the only cough medication that works for her.  Patient will be discharged with instructions to orally hydrate, rest, and use over-the-counter medications such as anti-inflammatories ibuprofen or Aleve for muscle aches  Patient has follow-up appointment with her PCP on 05/24/2021 where she can be reevaluated.  All labs and imaging were independently reviewed and interpreted by myself, Raynald Blend  Final Clinical Impression(s) / ED Diagnoses Final diagnoses:  Viral upper respiratory tract infection  COPD with exacerbation (HCC)    Rx / DC Orders ED Discharge Orders          Ordered    benzonatate (TESSALON) 100 MG capsule  Every 8 hours,   Status:  Discontinued        05/17/21 1740    predniSONE (DELTASONE) 50 MG tablet  Daily,   Status:  Discontinued        05/17/21 1740    predniSONE (DELTASONE) 50 MG tablet  Daily        05/17/21 1801    acetaminophen-codeine (TYLENOL #3) 300-30 MG tablet  Every 6 hours PRN        05/17/21 1801             Janell Quiet, PA-C 05/17/21 1814    Rolan Bucco, MD 05/17/21 2311

## 2021-05-17 NOTE — ED Notes (Signed)
Pt states tessalon does not work for her, requesting other cough medicine. Pt states she is not allergic to codeine as listed on her chart. Provider notified.

## 2021-05-17 NOTE — Discharge Instructions (Addendum)
I have sent in the medications for your cough as well is a 5-day course of steroids.  Please keep your appointment on 05/24/2021 with your PCP for follow-up.  You tested negative for COVID and flu, and your chest x-ray showed no signs of pneumonia or rib fracture.  If you develop worsening shortness of breath and difficulty breathing please return to the ED, otherwise please follow-up with your PCP.

## 2021-05-17 NOTE — ED Triage Notes (Signed)
Fever,cough, headache and sob. She has limited motion in her right shoulder.

## 2021-09-19 IMAGING — DX DG CHEST 1V PORT
1 series · 1 of 1 positions shown · non-contrast
Comparison: 09/23/2014

CLINICAL DATA: Cough and wheezing for several days. History of
COPD. Smoker.

EXAM:
PORTABLE CHEST 1 VIEW

[chest ap]
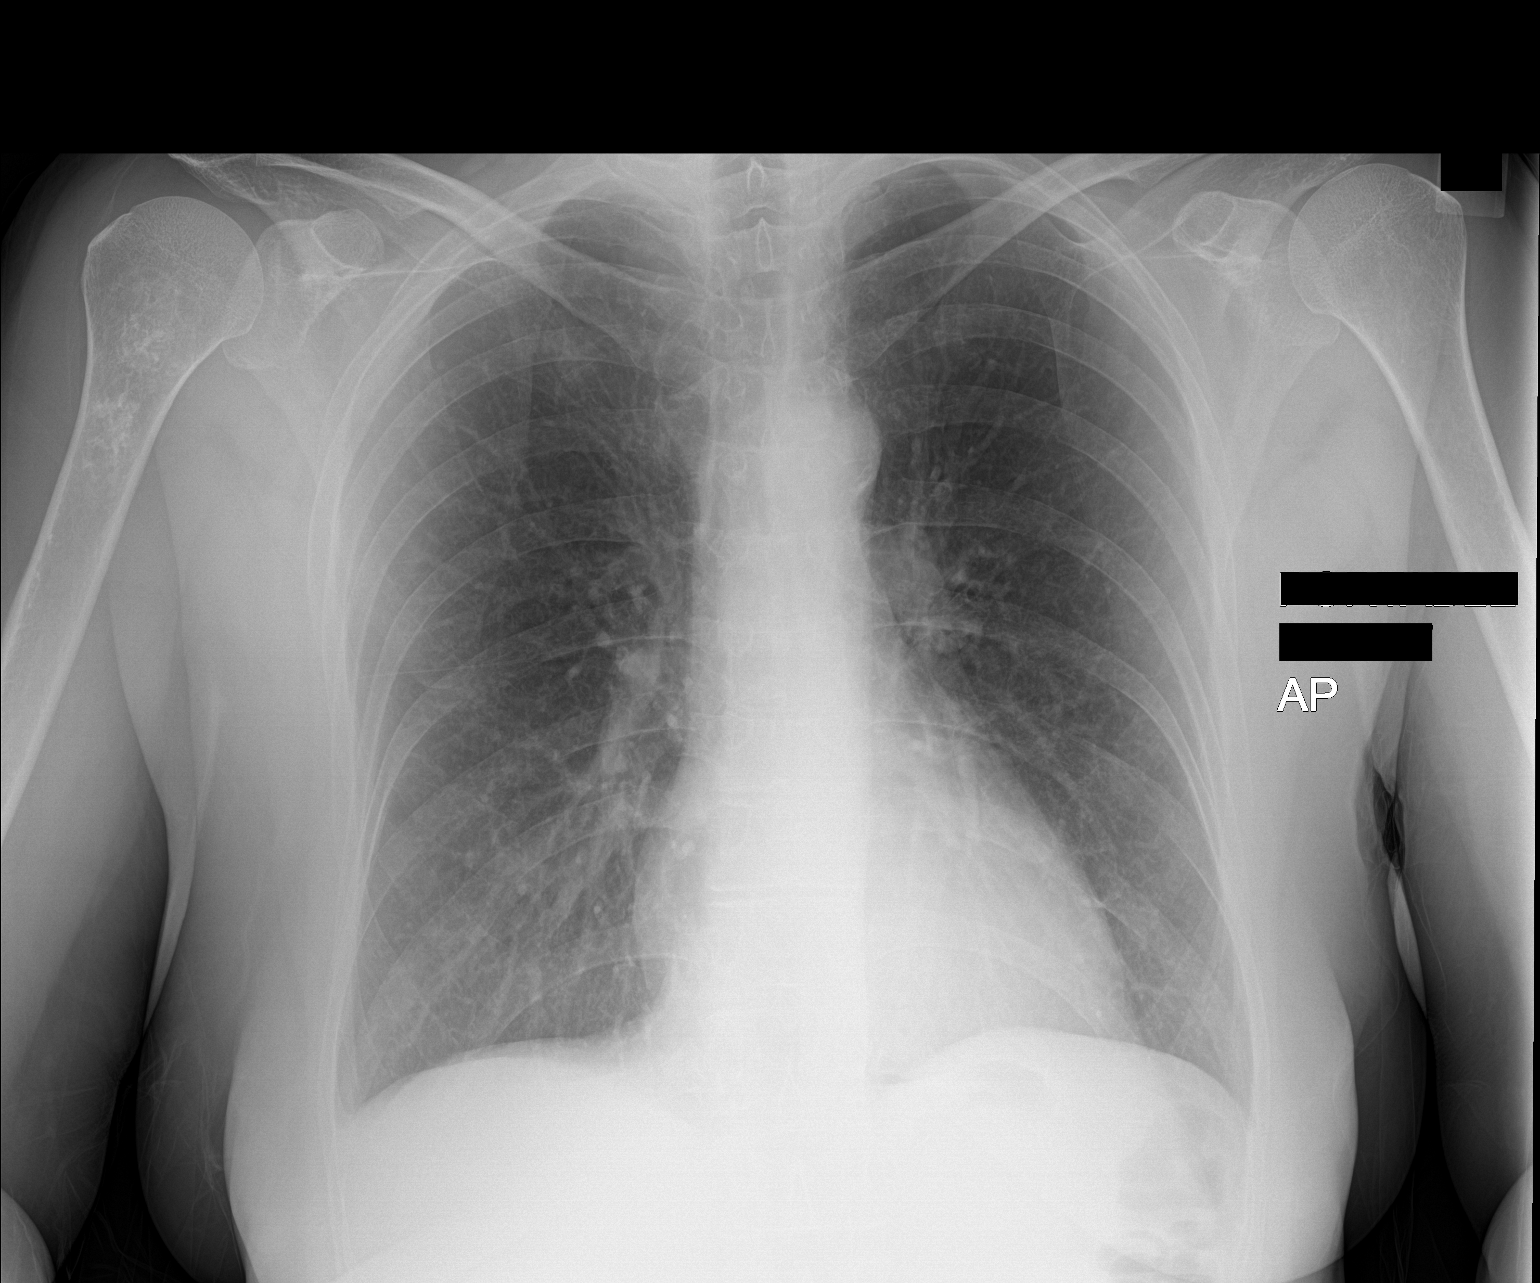

[1 of 1 positions shown; findings below may reference images not displayed]

FINDINGS: Emphysematous changes in the lungs. Heart size and pulmonary
vascularity are normal. No airspace disease or consolidation in the
lungs. No pleural effusions. No pneumothorax. Mediastinal contours
appear intact. Calcification of the aorta. Calcifications in the
proximal right humerus likely representing enchondroma.
IMPRESSION: Emphysematous changes in the lungs. No evidence of active pulmonary
disease.

## 2022-02-05 ENCOUNTER — Other Ambulatory Visit: Payer: Self-pay

## 2022-02-05 ENCOUNTER — Encounter (HOSPITAL_BASED_OUTPATIENT_CLINIC_OR_DEPARTMENT_OTHER): Payer: Self-pay | Admitting: Emergency Medicine

## 2022-02-05 ENCOUNTER — Emergency Department (HOSPITAL_BASED_OUTPATIENT_CLINIC_OR_DEPARTMENT_OTHER)
Admission: EM | Admit: 2022-02-05 | Discharge: 2022-02-06 | Disposition: A | Discharge status: Home / Self Care | Payer: Medicaid Other | Attending: Emergency Medicine | Admitting: Emergency Medicine

## 2022-02-05 ENCOUNTER — Emergency Department (HOSPITAL_BASED_OUTPATIENT_CLINIC_OR_DEPARTMENT_OTHER): Payer: Medicaid Other

## 2022-02-05 DIAGNOSIS — J209 Acute bronchitis, unspecified: Secondary | ICD-10-CM | POA: Insufficient documentation

## 2022-02-05 DIAGNOSIS — Z20822 Contact with and (suspected) exposure to covid-19: Secondary | ICD-10-CM | POA: Insufficient documentation

## 2022-02-05 DIAGNOSIS — R059 Cough, unspecified: Secondary | ICD-10-CM | POA: Diagnosis present

## 2022-02-05 DIAGNOSIS — J441 Chronic obstructive pulmonary disease with (acute) exacerbation: Secondary | ICD-10-CM | POA: Diagnosis not present

## 2022-02-05 HISTORY — DX: Post covid-19 condition, unspecified: U09.9

## 2022-02-05 LAB — RESP PANEL BY RT-PCR (FLU A&B, COVID) ARPGX2
Influenza A by PCR: NEGATIVE
Influenza B by PCR: NEGATIVE
SARS Coronavirus 2 by RT PCR: NEGATIVE

## 2022-02-05 MED ORDER — DOXYCYCLINE HYCLATE 100 MG PO CAPS: 100.0000 mg | ORAL_CAPSULE | Freq: Two times a day (BID) | ORAL | 0 refills | Status: AC

## 2022-02-05 MED ORDER — PREDNISONE 10 MG PO TABS
40.0000 mg | ORAL_TABLET | Freq: Every day | ORAL | 0 refills | Status: AC
Start: 2022-02-05 — End: 2022-02-10

## 2022-02-05 MED ORDER — PREDNISONE 50 MG PO TABS
60.0000 mg | ORAL_TABLET | Freq: Once | ORAL | Status: AC
  Administered 2022-02-05: 60 mg via ORAL
  Filled 2022-02-05: qty 1

## 2022-02-05 MED ORDER — BENZONATATE 200 MG PO CAPS: 200.0000 mg | ORAL_CAPSULE | Freq: Three times a day (TID) | ORAL | 0 refills | Status: AC

## 2022-02-05 MED ORDER — IPRATROPIUM-ALBUTEROL 0.5-2.5 (3) MG/3ML IN SOLN
3.0000 mL | Freq: Once | RESPIRATORY_TRACT | Status: AC
  Administered 2022-02-05: 3 mL via RESPIRATORY_TRACT
  Filled 2022-02-05: qty 3

## 2022-02-05 MED ORDER — GUAIFENESIN 100 MG/5ML PO LIQD
5.0000 mL | Freq: Once | ORAL | Status: AC
  Administered 2022-02-05: 5 mL via ORAL

## 2022-02-05 MED ORDER — BENZONATATE 100 MG PO CAPS
200.0000 mg | ORAL_CAPSULE | Freq: Once | ORAL | Status: DC
  Filled 2022-02-05: qty 2

## 2022-02-05 MED ORDER — DOXYCYCLINE HYCLATE 100 MG PO TABS
100.0000 mg | ORAL_TABLET | Freq: Once | ORAL | Status: AC
  Administered 2022-02-05: 100 mg via ORAL
  Filled 2022-02-05: qty 1

## 2022-02-05 MED ORDER — GUAIFENESIN 100 MG/5ML PO LIQD
5.0000 mL | Freq: Once | ORAL | Status: DC
  Filled 2022-02-05: qty 10

## 2022-02-05 NOTE — Discharge Instructions (Signed)
Take antibiotics as prescribed and complete the full course. Take prednisone as prescribed. Take Tessalon as needed as prescribed for cough.  Check with your primary care provider.  Return to ER for worsening or concerning symptoms.

## 2022-02-05 NOTE — ED Provider Notes (Signed)
[facility] EMERGENCY DEPARTMENT Provider Note   CSN: [id number] Arrival date & time: 02/05/22  2125     History  Chief Complaint  Patient presents with   URI    Mallory Santiago is a 56 y.o. female.  56 year old female with past medical history of long COVID cough, COPD and hypertension presents with complaint of cough, congestion for the past 10 days.  Denies fevers or chills.  Cough is noted to be nonproductive.  Patient is taking Tylenol at home for her back pain, not taking any medication for her cough as she states that is why she is here today.  Patient is a former smoker.  Notes that her fianc just now started getting sick otherwise no sick contacts.  Last her nebulizer earlier today.  No other complaints or concerns.       Home Medications Prior to Admission medications   Medication Sig Start Date End Date Taking? Authorizing Provider  benzonatate (TESSALON) 200 MG capsule Take 1 capsule (200 mg total) by mouth every 8 (eight) hours for 10 days. 02/05/22 02/15/22 Yes Jeannie Fend, PA-C  doxycycline (VIBRAMYCIN) 100 MG capsule Take 1 capsule (100 mg total) by mouth 2 (two) times daily. 02/05/22  Yes Jeannie Fend, PA-C  predniSONE (DELTASONE) 10 MG tablet Take 4 tablets (40 mg total) by mouth daily for 5 days. 02/05/22 02/10/22 Yes Jeannie Fend, PA-C  acetaminophen-codeine (TYLENOL #3) 300-30 MG tablet Take 1-2 tablets by mouth every 6 (six) hours as needed for moderate pain. 05/17/21   Janell Quiet, PA-C  levofloxacin (LEVAQUIN) 500 MG tablet Take 1 tablet (500 mg total) by mouth daily. 06/13/19   Molpus, John, MD  levothyroxine (SYNTHROID, LEVOTHROID) 50 MCG tablet Take 25 mcg by mouth daily before breakfast.    [provider]  lisinopril (PRINIVIL,ZESTRIL) 10 MG tablet Take 10 mg by mouth daily.    [provider]  albuterol (PROVENTIL HFA;VENTOLIN HFA) 108 (90 BASE) MCG/ACT inhaler Inhale 1-2 puffs into the lungs every 6 (six) hours as needed  for wheezing. 09/23/14 06/13/19  Rolland Porter, MD      Allergies    Codeine and Promethazine-dm    Review of Systems   Review of Systems Negative except as per HPI Physical Exam Updated Vital Signs BP (!) 143/77 (BP Location: Left Arm)   Pulse 70   Temp 98.2 F (36.8 C) (Oral)   Resp 18   Ht 5\' 2"  (1.575 m)   Wt 59.9 kg   SpO2 95%   BMI 24.14 kg/m  Physical Exam Vitals and nursing note reviewed.  Constitutional:      General: She is not in acute distress.    Appearance: She is well-developed. She is not diaphoretic.  HENT:     Head: Normocephalic and atraumatic.     Right Ear: Tympanic membrane and ear canal normal.     Left Ear: Tympanic membrane and ear canal normal.  Eyes:     Conjunctiva/sclera: Conjunctivae normal.  Cardiovascular:     Rate and Rhythm: Normal rate and regular rhythm.  Pulmonary:     Effort: Pulmonary effort is normal.     Breath sounds: Wheezing present.  Musculoskeletal:     Cervical back: Neck supple.  Skin:    General: Skin is warm and dry.     Findings: No erythema or rash.  Neurological:     Mental Status: She is alert and oriented to person, place, and time.  Psychiatric:  Behavior: Behavior normal.     ED Results / Procedures / Treatments   Labs (all labs ordered are listed, but only abnormal results are displayed) Labs Reviewed  RESP PANEL BY RT-PCR (FLU A&B, COVID) ARPGX2    EKG None  Radiology DG Chest 2 View  Result Date: 02/05/2022 CLINICAL DATA:  Cough EXAM: CHEST - 2 VIEW COMPARISON:  Chest x-ray 10/29/2021 FINDINGS: The heart size and mediastinal contours are within normal limits. Both lungs are clear. Mild chronic wedge deformity of the midthoracic vertebral body is unchanged. IMPRESSION: No active cardiopulmonary disease. Electronically Signed   By: Darliss Cheney M.D.   On: 02/05/2022 21:58    Procedures Procedures    Medications Ordered in ED Medications  ipratropium-albuterol (DUONEB) 0.5-2.5 (3) MG/3ML  nebulizer solution 3 mL (has no administration in time range)  benzonatate (TESSALON) capsule 200 mg (has no administration in time range)  guaiFENesin (ROBITUSSIN) 100 MG/5ML liquid 5 mL (5 mLs Oral Given 02/05/22 2145)  predniSONE (DELTASONE) tablet 60 mg (60 mg Oral Given 02/05/22 2352)  doxycycline (VIBRA-TABS) tablet 100 mg (100 mg Oral Given 02/05/22 2352)    ED Course/ Medical Decision Making/ A&P                           Medical Decision Making Amount and/or Complexity of Data Reviewed Radiology: ordered.  Risk OTC drugs. Prescription drug management.   56 year old female with URI symptoms x10 days.  History of COPD.  Found to have constant dry cough.  Lungs with mild wheezing.  Chest x-ray shows no acute cardiopulmonary disease.  Patient is negative for COVID and flu.  Plan is to treat with prednisone, DuoNeb and Tessalon.  Likely discharge with antibiotics due to her underlying lung disease history.        Final Clinical Impression(s) / ED Diagnoses Final diagnoses:  Acute bronchitis, unspecified organism  COPD exacerbation (HCC)    Rx / DC Orders ED Discharge Orders          Ordered    doxycycline (VIBRAMYCIN) 100 MG capsule  2 times daily        02/05/22 2342    benzonatate (TESSALON) 200 MG capsule  Every 8 hours        02/05/22 2343    predniSONE (DELTASONE) 10 MG tablet  Daily        02/05/22 2343              Jeannie Fend, PA-C 02/05/22 2352    Sloan Leiter, DO 02/08/22 [phone]

## 2022-02-05 NOTE — ED Triage Notes (Signed)
Cough, headache, sinus congestion, with chest and back pain. X 10 days

## 2022-02-06 NOTE — ED Provider Notes (Signed)
Called to patient's room by RN prior to her discharge to answer questions about her medications. She is once again asking for a narcotic cough syrup. I reviewed her PDMP database which reveals ~10 prescriptions for narcotic cough syrup already this year. She was asking questions about 'long covid' and I explained to her that since this is a chronic problem, I do not have experience managing that and recommend she call her PCP.    Pollyann Savoy, MD 02/06/22 [phone]

## 2023-01-19 IMAGING — DX DG CHEST 2V
2 series · 2 of 2 positions shown · non-contrast
Comparison: Chest x-ray 07/04/2020.

CLINICAL DATA: Wheezing, fever and cough.

EXAM:
CHEST - 2 VIEW

[chest pa]
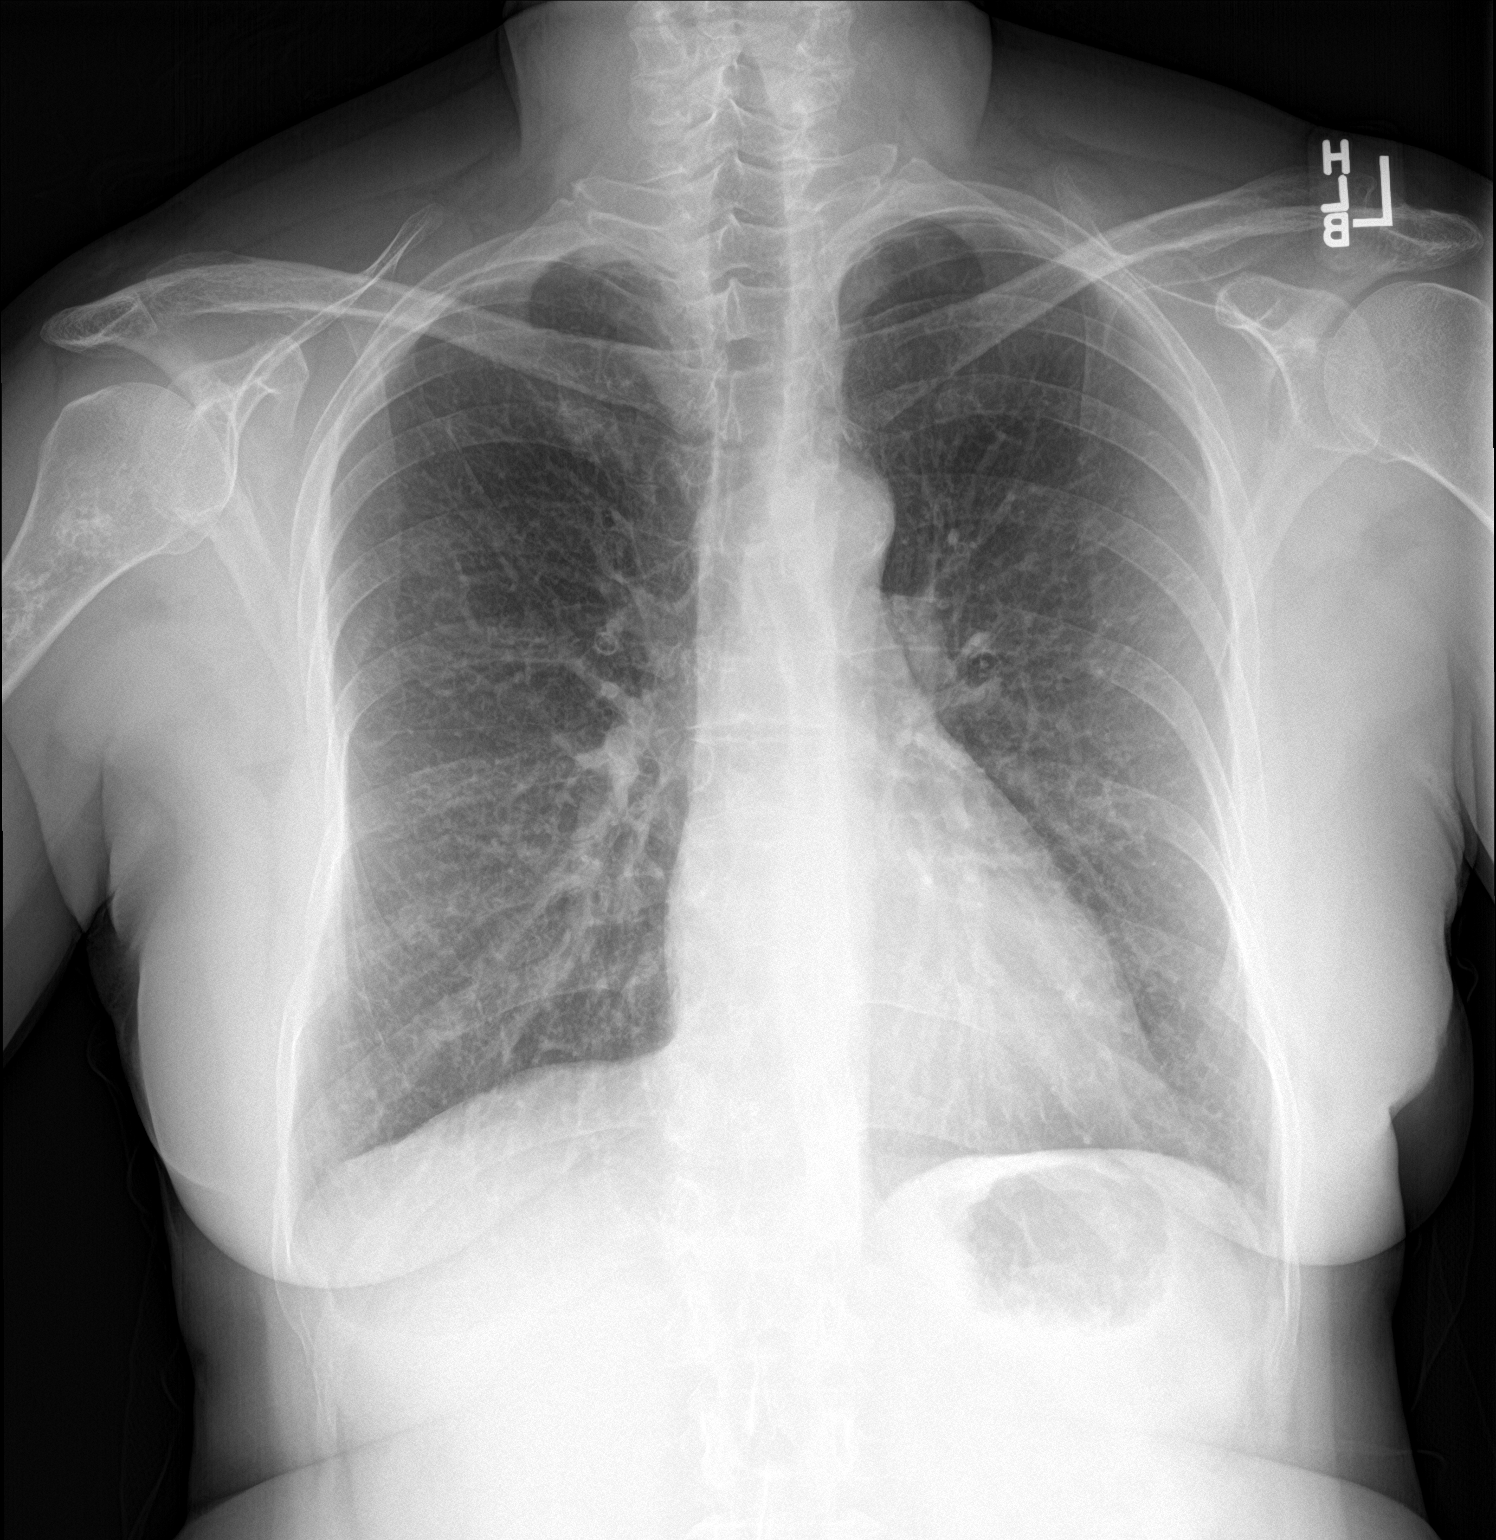

[chest lat]
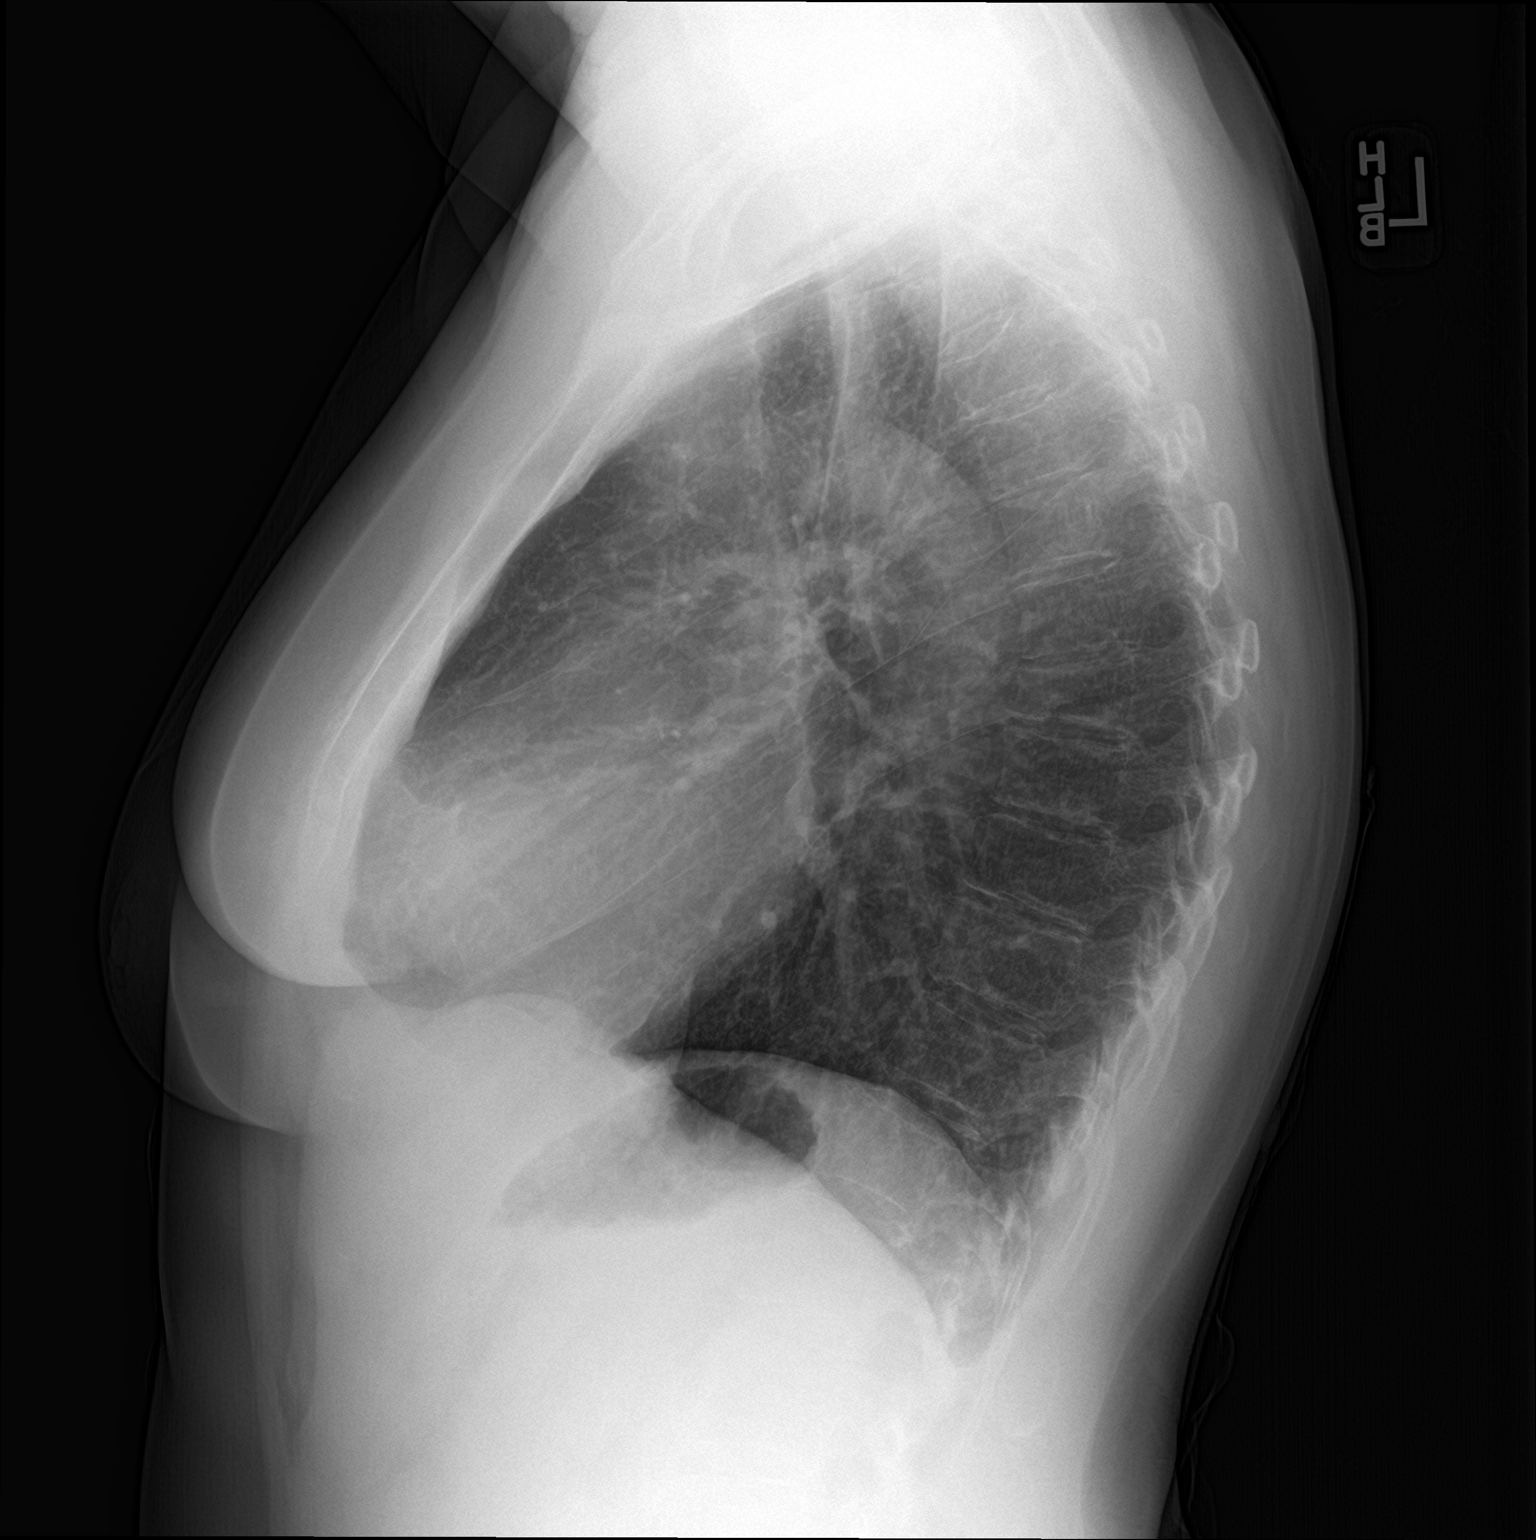

[2 of 2 positions shown; findings below may reference images not displayed]

FINDINGS: The heart size and mediastinal contours are within normal limits.
Both lungs are clear. Mild compression deformity of midthoracic
vertebral body is unchanged. No acute fractures are seen. Sclerosis
in the proximal right humerus is unchanged.
IMPRESSION: No active cardiopulmonary disease.

## 2023-01-30 ENCOUNTER — Telehealth (HOSPITAL_COMMUNITY): Payer: Self-pay

## 2023-01-30 NOTE — Telephone Encounter (Signed)
Pt returned phone call about Going to [facility] or [facility]. She prefers to go to [facility]. Did explain they are scheduling out into October. She said that is fine closing referral.
